# Patient Record
Sex: Male | Born: 1965 | Race: White | Hispanic: No | Marital: Married
Health system: Southern US, Community
[De-identification: ages and names within clinical notes are randomized; demographics above are authoritative.]

## PROBLEM LIST (undated history)

## (undated) DIAGNOSIS — K760 Fatty (change of) liver, not elsewhere classified: Secondary | ICD-10-CM

## (undated) DIAGNOSIS — R202 Paresthesia of skin: Secondary | ICD-10-CM

## (undated) DIAGNOSIS — B349 Viral infection, unspecified: Secondary | ICD-10-CM

## (undated) DIAGNOSIS — N529 Male erectile dysfunction, unspecified: Secondary | ICD-10-CM

## (undated) DIAGNOSIS — K219 Gastro-esophageal reflux disease without esophagitis: Secondary | ICD-10-CM

## (undated) DIAGNOSIS — B009 Herpesviral infection, unspecified: Secondary | ICD-10-CM

## (undated) DIAGNOSIS — K449 Diaphragmatic hernia without obstruction or gangrene: Secondary | ICD-10-CM

## (undated) DIAGNOSIS — Z8679 Personal history of other diseases of the circulatory system: Secondary | ICD-10-CM

## (undated) DIAGNOSIS — F419 Anxiety disorder, unspecified: Secondary | ICD-10-CM

## (undated) HISTORY — DX: Male erectile dysfunction, unspecified: N52.9

## (undated) HISTORY — DX: Diaphragmatic hernia without obstruction or gangrene: K44.9

## (undated) HISTORY — DX: Fatty (change of) liver, not elsewhere classified: K76.0

## (undated) HISTORY — PX: SKIN CANCER EXCISION: SHX779

## (undated) HISTORY — DX: Herpesviral infection, unspecified: B00.9

## (undated) HISTORY — DX: Gastro-esophageal reflux disease without esophagitis: K21.9

## (undated) HISTORY — DX: Viral infection, unspecified: B34.9

## (undated) HISTORY — DX: Personal history of other diseases of the circulatory system: Z86.79

## (undated) HISTORY — DX: Anxiety disorder, unspecified: F41.9

## (undated) HISTORY — DX: Paresthesia of skin: R20.2

## (undated) HISTORY — PX: OTHER SURGICAL HISTORY: SHX169

---

## 1992-07-12 HISTORY — PX: OTHER SURGICAL HISTORY: SHX169

## 2003-08-28 ENCOUNTER — Emergency Department (HOSPITAL_COMMUNITY): Admission: AD | Admit: 2003-08-28 | Discharge: 2003-08-28 | Payer: Self-pay | Admitting: Family Medicine

## 2003-08-30 ENCOUNTER — Encounter: Admission: RE | Admit: 2003-08-30 | Discharge: 2003-08-30 | Payer: Self-pay | Admitting: Family Medicine

## 2003-09-09 ENCOUNTER — Encounter: Admission: RE | Admit: 2003-09-09 | Discharge: 2003-09-09 | Payer: Self-pay | Admitting: Family Medicine

## 2004-07-06 IMAGING — CT CT PELVIS W/ CM
1 series · 16 of 32 positions shown, 20 images · IV contrast (GASTRO' & OMNIUPAQUE 100)
Comparison: none

CLINICAL DATA: Elevated liver function studies.  Con ?
 CT ABDOMEN AND PELVIS WITH CONTRAST
TECHNIQUE: Routine helical imaging carried out through the abdomen and pelvis utilizing oral and IV contrast (100 cc Omnipaque 300).  No prior exams for comparison:

[Series 2: routine abdomen · axial · 0.70mm/px · z∈[-368,+7]mm · 16 of 113 slices shown, 20 images]
[im 8/113  soft-tissue]
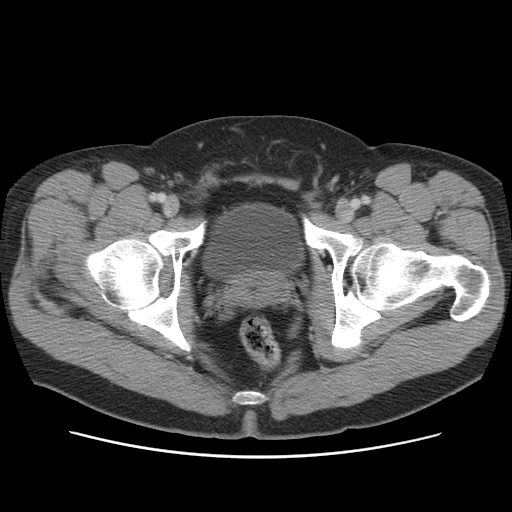
[im 8/113  bone]
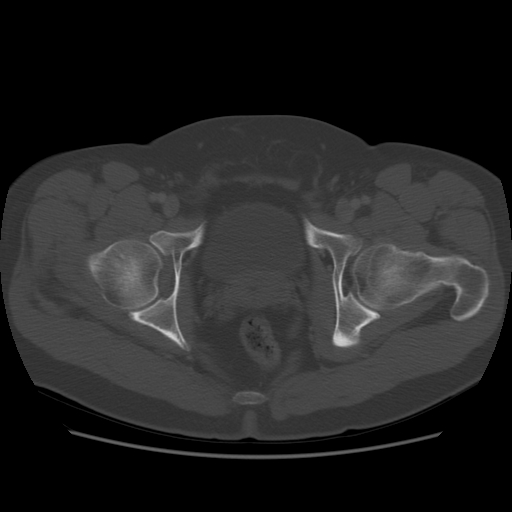
[im 15/113  soft-tissue]
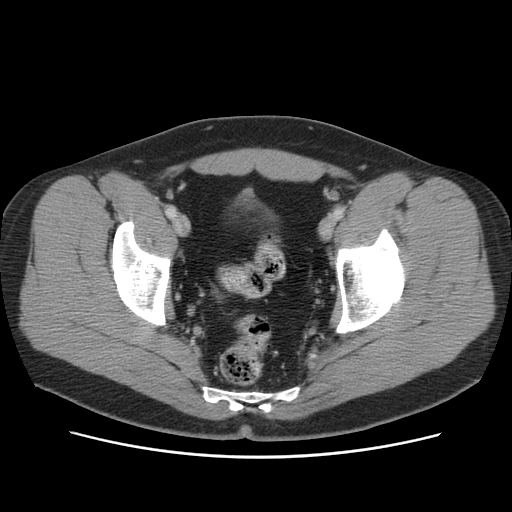
[im 22/113  soft-tissue]
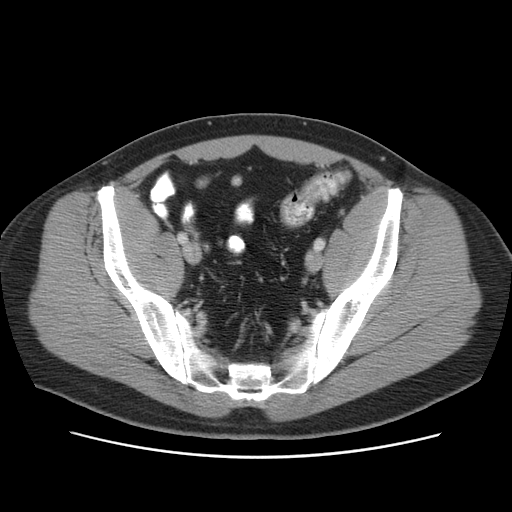
[im 29/113  soft-tissue]
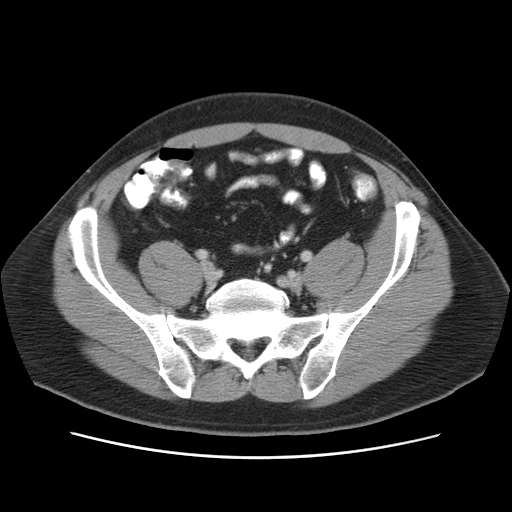
[im 37/113  soft-tissue]
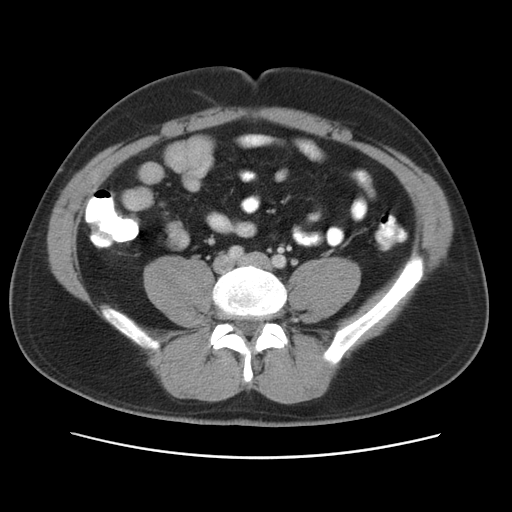
[im 44/113  soft-tissue]
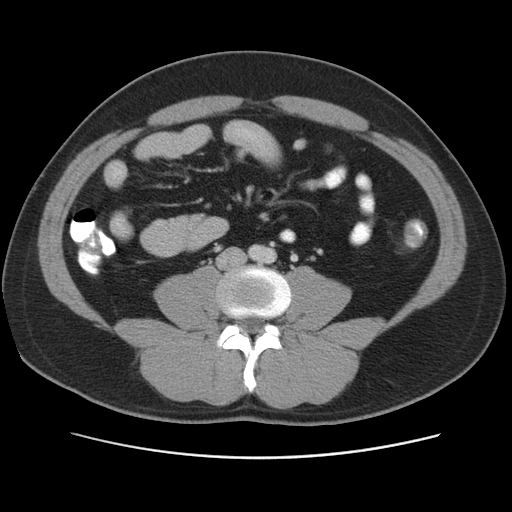
[im 51/113  soft-tissue]
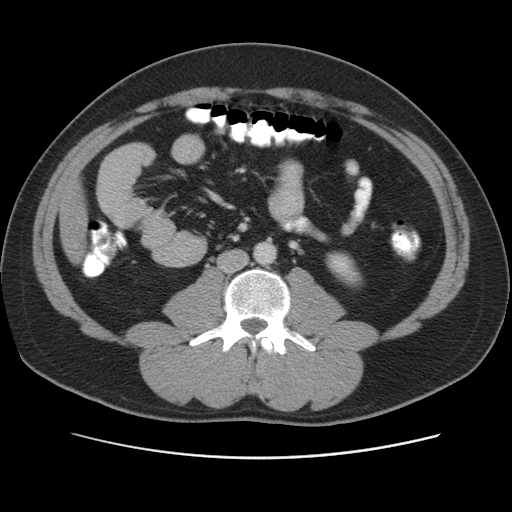
[im 62/113  soft-tissue]
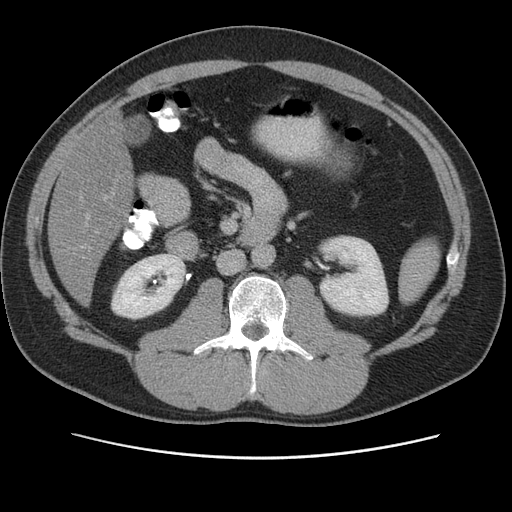
[im 69/113  soft-tissue]
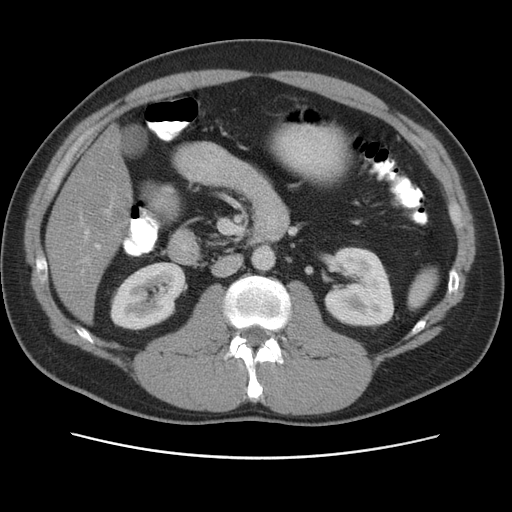
[im 69/113  bone]
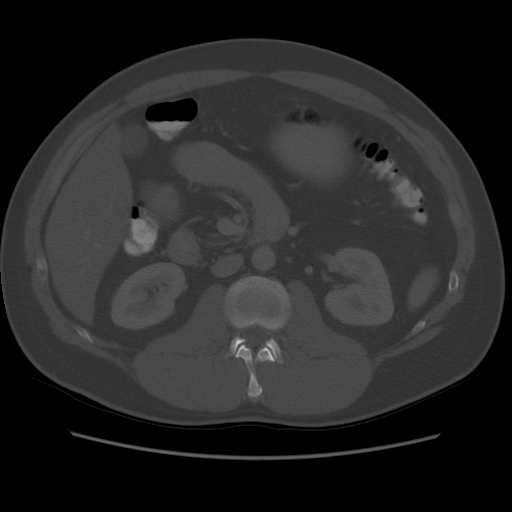
[im 76/113  soft-tissue]
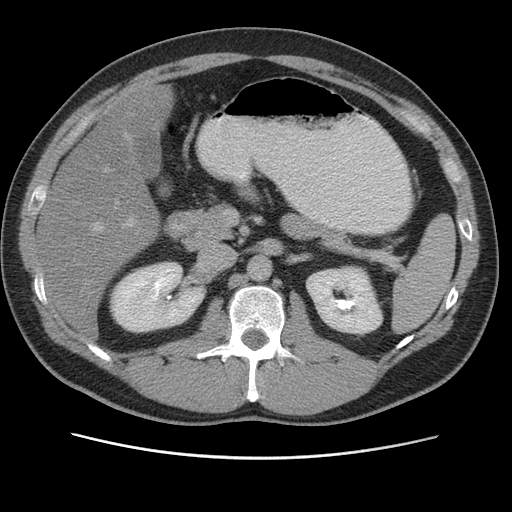
[im 84/113  soft-tissue]
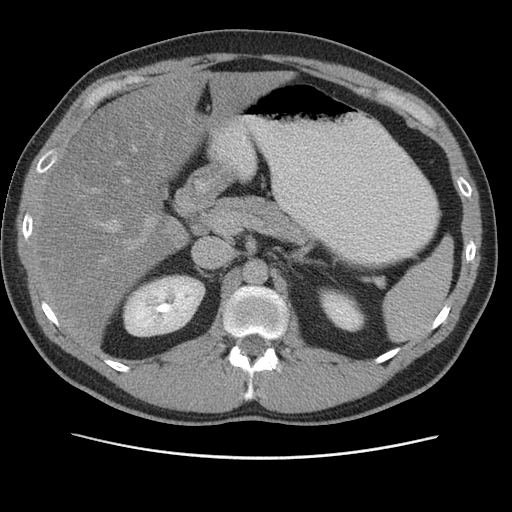
[im 91/113  soft-tissue]
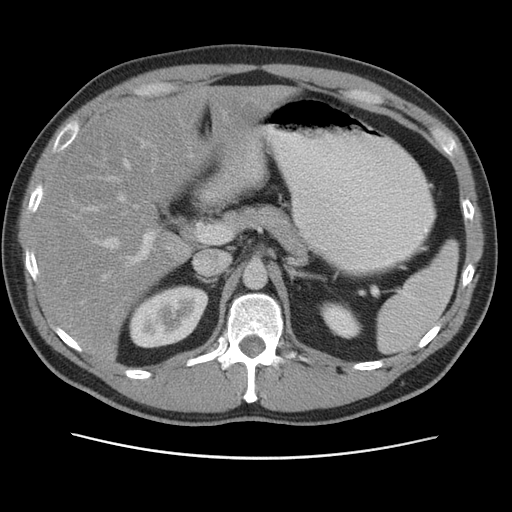
[im 98/113  soft-tissue]
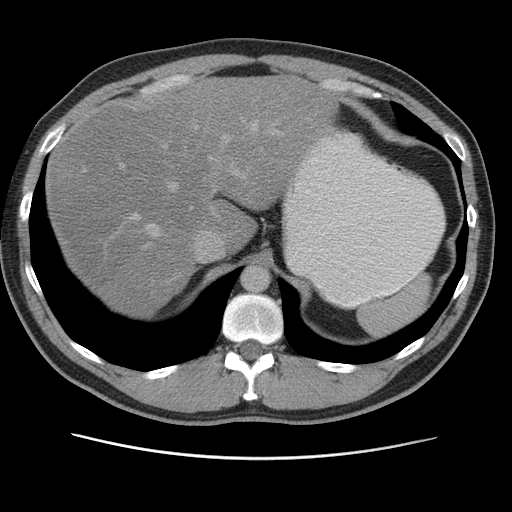
[im 98/113  lung]
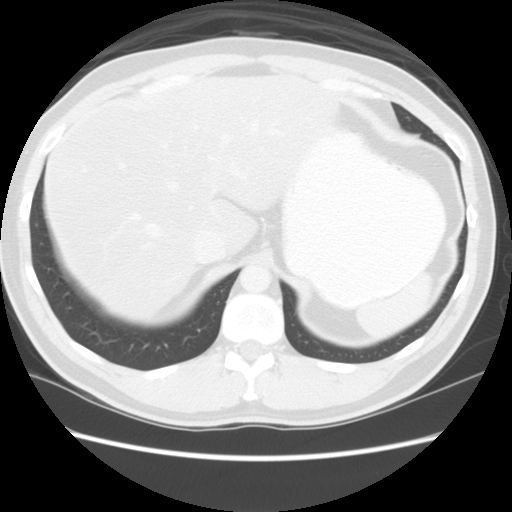
[im 102/113  lung]
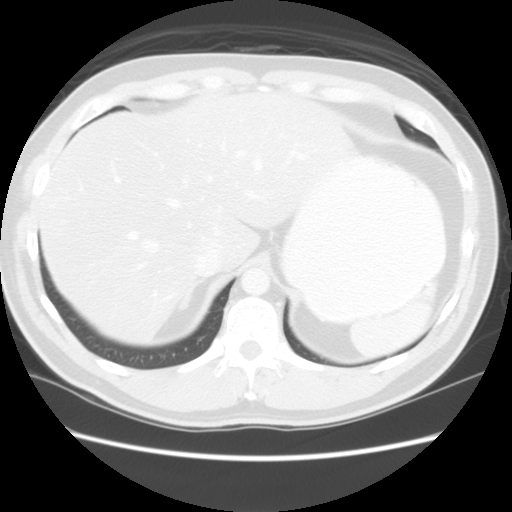
[im 105/113  soft-tissue]
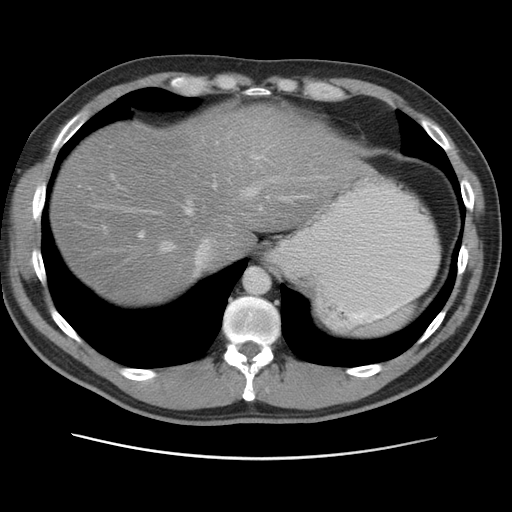
[im 105/113  lung]
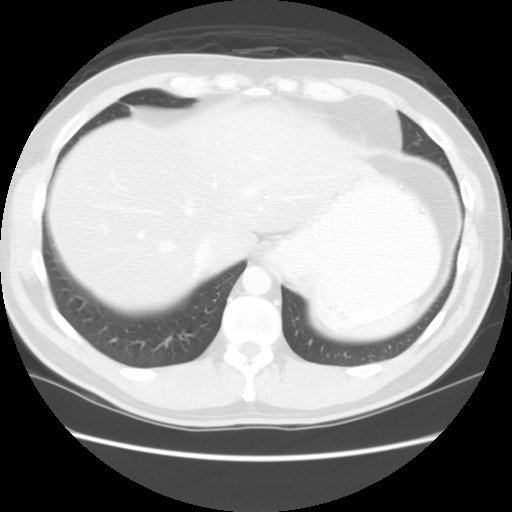
[im 109/113  lung]
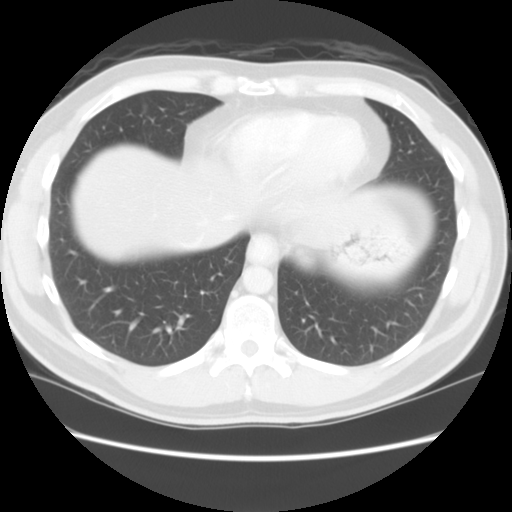

[16 of 32 positions shown; findings below may reference images not displayed]

CT ABDOMEN WITH CONTRAST:
 Lung bases clear.  Diffuse fatty infiltration of the liver with no focal lesions.  No calcified gallstones or bile duct dilatation.  Spleen, pancreas, and adrenals normal.  No adenopathy or ascites.  No renal lesions.
 IMPRESSION
 Normal except for fatty infiltration of the liver. 

 CT PELVIS WITH CONTRAST:
 No focal masses, adenopathy or fluid collections.  No changes to suggest diverticulitis.  There are some central prostatic calcifications incidentally noted. 

 There is mild dilatation of both inguinal rings ? left greater than right.  I cannot rule out inguinal hernias.  Careful clinical correlation warranted. 
 IMPRESSION
 No acute or significant findings.  There may be small bilateral inguinal hernias.

## 2008-12-17 ENCOUNTER — Emergency Department (HOSPITAL_COMMUNITY): Admission: EM | Admit: 2008-12-17 | Discharge: 2008-12-17 | Payer: Self-pay | Admitting: Emergency Medicine

## 2009-10-14 IMAGING — CR DG CHEST 2V
2 series · 2 of 2 positions shown · non-contrast
Comparison: None

CLINICAL DATA: Heart flutter

CHEST - 2 VIEW

[w chest pa]
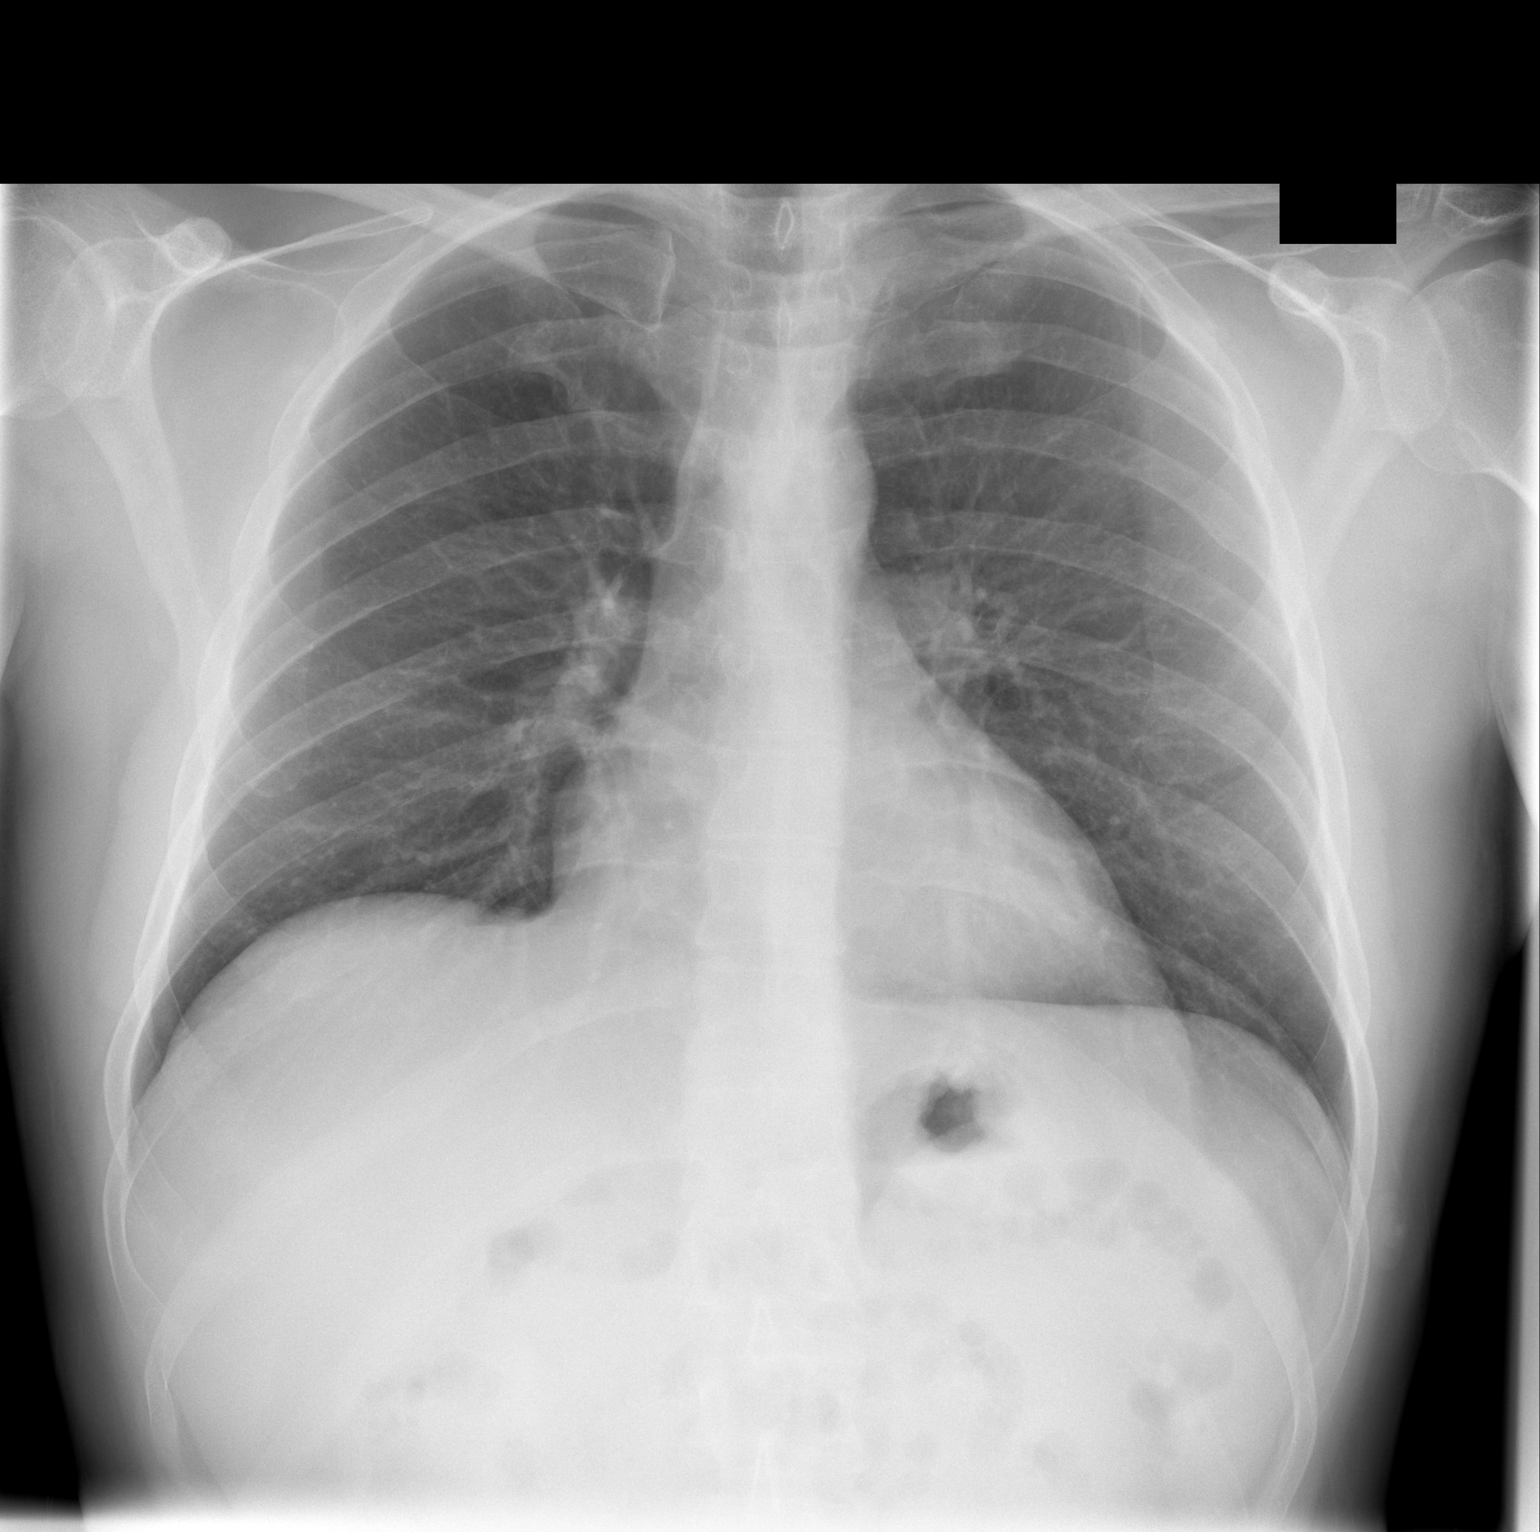

[w chest lat]
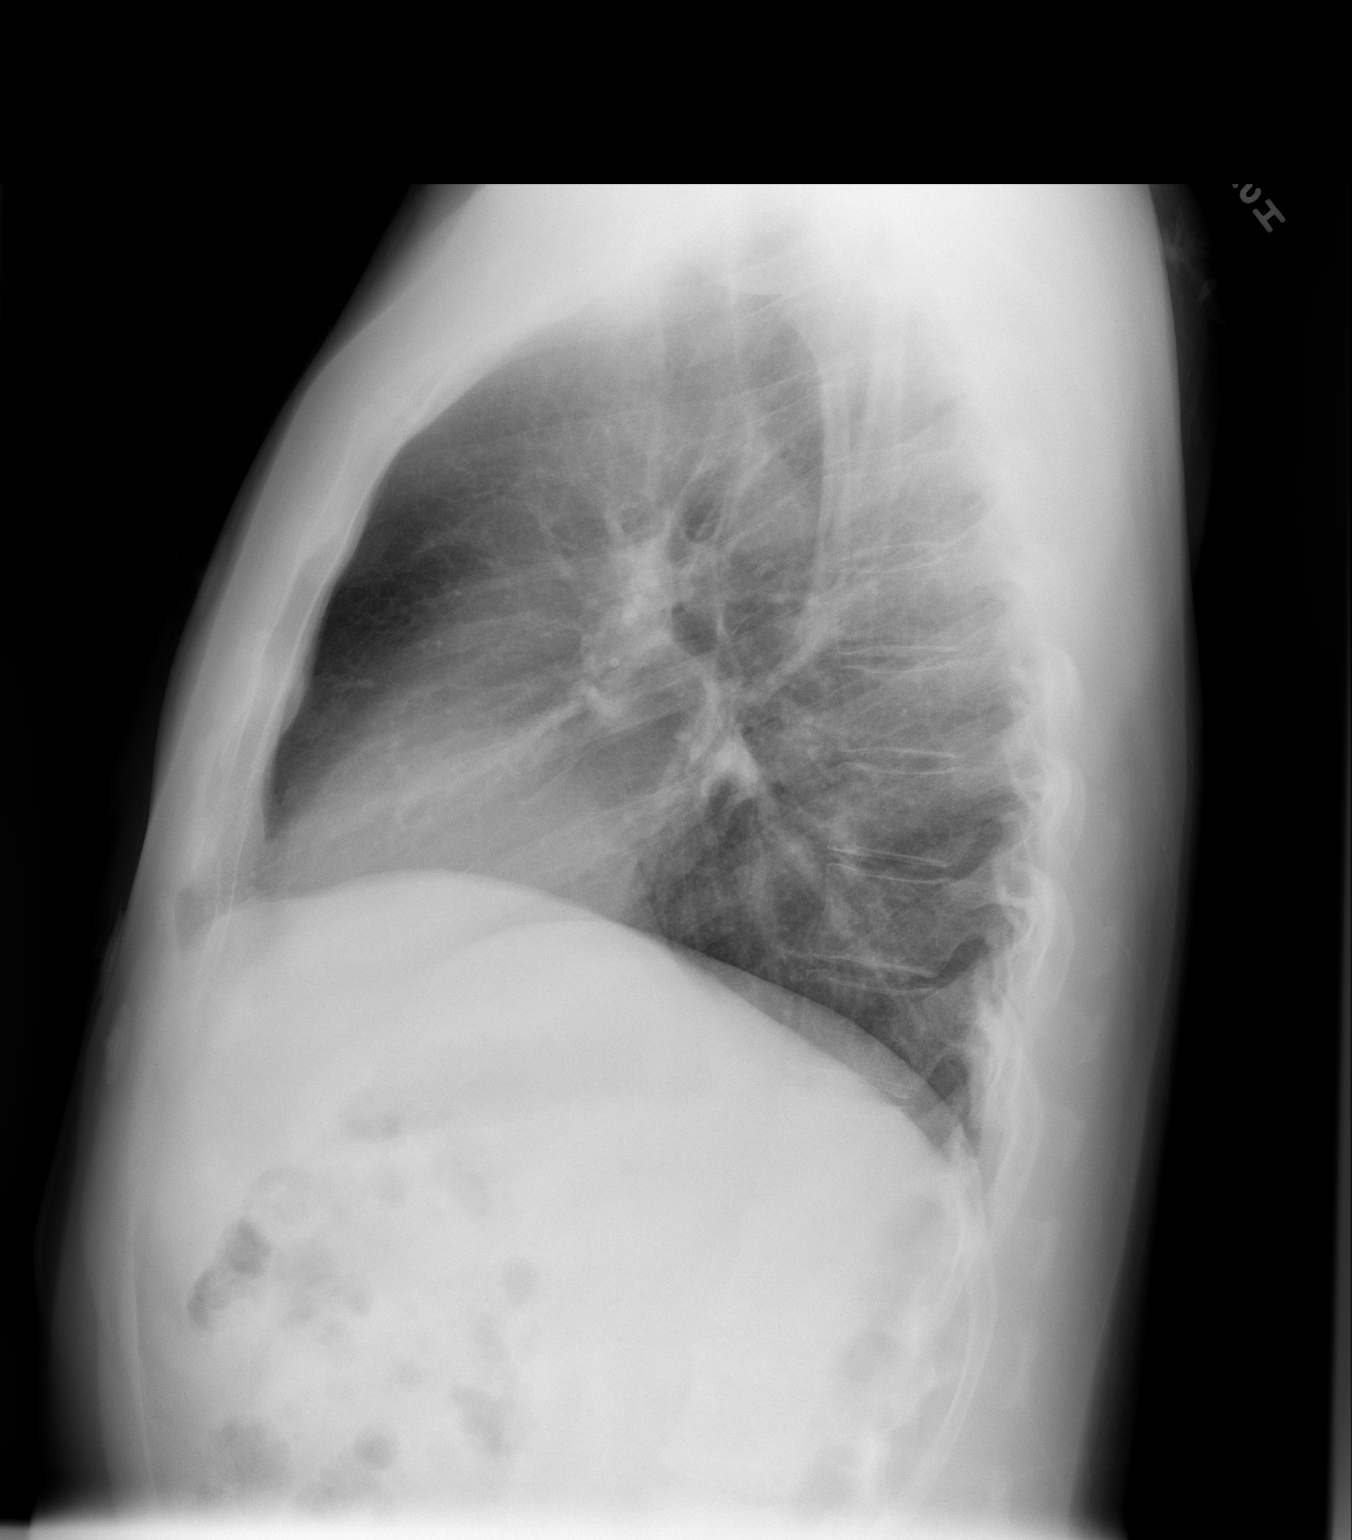

[2 of 2 positions shown; findings below may reference images not displayed]

FINDINGS: The cardiac silhouette, mediastinum, pulmonary
vasculature are within normal limits.  Both lungs are clear.  The
osseous structures are unremarkable.
IMPRESSION: Normal chest x-ray.

## 2010-10-19 LAB — D-DIMER, QUANTITATIVE: D-Dimer, Quant: <0.22 ug/mL-FEU (ref 0.00–0.48)

## 2010-10-19 LAB — CBC
HCT: 47.2 % (ref 39.0–52.0)
Hemoglobin: 15.9 g/dL (ref 13.0–17.0)
Platelets: 330 10*3/uL (ref 150–400)
WBC: 6.4 10*3/uL (ref 4.0–10.5)

## 2010-10-19 LAB — URINALYSIS, ROUTINE W REFLEX MICROSCOPIC
Bilirubin Urine: NEGATIVE
Glucose, UA: NEGATIVE mg/dL
Hgb urine dipstick: NEGATIVE
Ketones, ur: NEGATIVE mg/dL
Protein, ur: NEGATIVE mg/dL

## 2010-10-19 LAB — COMPREHENSIVE METABOLIC PANEL
Albumin: 4.4 g/dL (ref 3.5–5.2)
Alkaline Phosphatase: 66 U/L (ref 39–117)
BUN: 12 mg/dL (ref 6–23)
Chloride: 104 mEq/L (ref 96–112)
Glucose, Bld: 95 mg/dL (ref 70–99)
Potassium: 4 mEq/L (ref 3.5–5.1)
Total Bilirubin: 0.9 mg/dL (ref 0.3–1.2)

## 2010-10-19 LAB — DIFFERENTIAL
Basophils Absolute: 0 10*3/uL (ref 0.0–0.1)
Basophils Relative: 1 % (ref 0–1)
Eosinophils Absolute: 0.1 10*3/uL (ref 0.0–0.7)
Monocytes Absolute: 0.6 10*3/uL (ref 0.1–1.0)
Neutro Abs: 3.8 10*3/uL (ref 1.7–7.7)

## 2010-10-19 LAB — URINE CULTURE: Culture: NO GROWTH

## 2010-10-19 LAB — POCT CARDIAC MARKERS: Myoglobin, poc: 83.7 ng/mL (ref 12–200)

## 2021-03-30 ENCOUNTER — Telehealth (HOSPITAL_COMMUNITY): Payer: Self-pay

## 2021-03-30 NOTE — Telephone Encounter (Signed)
Attempted to contact patient to schedule OP MBS - left voicemail. ?

## 2021-04-02 ENCOUNTER — Other Ambulatory Visit (HOSPITAL_COMMUNITY): Payer: Self-pay

## 2021-04-02 DIAGNOSIS — R1310 Dysphagia, unspecified: Secondary | ICD-10-CM

## 2021-04-10 ENCOUNTER — Other Ambulatory Visit: Payer: Self-pay

## 2021-04-10 ENCOUNTER — Ambulatory Visit (HOSPITAL_COMMUNITY)
Admission: RE | Admit: 2021-04-10 | Discharge: 2021-04-10 | Disposition: A | Discharge status: Home / Self Care | Payer: 59 | Source: Ambulatory Visit | Attending: Internal Medicine | Admitting: Internal Medicine

## 2021-04-10 DIAGNOSIS — R1310 Dysphagia, unspecified: Secondary | ICD-10-CM | POA: Insufficient documentation

## 2023-07-28 ENCOUNTER — Encounter: Payer: Self-pay | Admitting: Cardiovascular Disease

## 2023-07-28 ENCOUNTER — Ambulatory Visit: Payer: 59 | Attending: Cardiovascular Disease | Admitting: Cardiovascular Disease

## 2023-07-28 VITALS — BP 116/78 | HR 75 | Ht 69.0 in | Wt 195.6 lb

## 2023-07-28 DIAGNOSIS — Z8679 Personal history of other diseases of the circulatory system: Secondary | ICD-10-CM

## 2023-07-28 DIAGNOSIS — R002 Palpitations: Secondary | ICD-10-CM

## 2023-07-28 NOTE — Patient Instructions (Signed)
 Medication Instructions:  Your physician recommends that you continue on your current medications as directed. Please refer to the Current Medication list given to you today. *If you need a refill on your cardiac medications before your next appointment, please call your pharmacy*   Follow-Up: At [facility], you and your health needs are our priority.  As part of our continuing mission to provide you with exceptional heart care, we have created designated Provider Care Teams.  These Care Teams include your primary Cardiologist (physician) and Advanced Practice Providers (APPs -  Physician Assistants and Nurse Practitioners) who all work together to provide you with the care you need, when you need it.  We recommend signing up for the patient portal called "MyChart".  Sign up information is provided on this After Visit Summary.  MyChart is used to connect with patients for Virtual Visits (Telemedicine).  Patients are able to view lab/test results, encounter notes, upcoming appointments, etc.  Non-urgent messages can be sent to your provider as well.   To learn more about what you can do with MyChart, go to [email or web address].    Your next appointment:   As needed  Provider:   York Pellant, MD

## 2023-07-28 NOTE — Progress Notes (Signed)
  Electrophysiology Office Note:    Date:  07/28/2023   ID:  Matthew Strickland, DOB [birth year], MRN [id number]  PCP:  Renford Dills, MD   [facility] Providers Cardiologist:  None     Referring MD: Renford Dills, MD   History of Present Illness:    Matthew Strickland is a 58 y.o. male with a medical history significant for WPW s/p ablation, referred for follow-up.       I discussed the use of AI scribe software for clinical note transcription with the patient, who gave verbal consent to proceed.  The patient, with a history of Wolff-Parkinson-White (WPW) syndrome, presents for a cardiology consultation on the recommendation of their primary care physician. They underwent ablation therapy for WPW in 1995, which was initially diagnosed in their early teens following an episode of tachycardia and arrhythmia while playing basketball. Prior to the ablation, they experienced rapid heart rates, but have had no recurrence of these symptoms since the procedure. The patient was previously on Inderal, but it is unclear if they are currently on any medications. They report no current issues with blood pressure or cholesterol.     Today, he reports that he is doing very well.  EKGs/Labs/Other Studies Reviewed Today:       EKG:   EKG Interpretation Date/Time:  Thursday July 28 2023 14:51:01 EST Ventricular Rate:  75 PR Interval:  148 QRS Duration:  92 QT Interval:  382 QTC Calculation: 426 R Axis:   51  Text Interpretation: Normal sinus rhythm Normal ECG Confirmed by York Pellant [phone]) on 07/28/2023 3:26:24 PM     Physical Exam:    VS:  BP 116/78 (BP Location: Left Arm, Patient Position: Sitting, Cuff Size: Large)   Pulse 75   Ht 5\' 9"  (1.753 m)   Wt 195 lb 9.6 oz (88.7 kg)   SpO2 98%   BMI 28.89 kg/m     Wt Readings from Last 3 Encounters:  07/28/23 195 lb 9.6 oz (88.7 kg)     GEN:  Well nourished, well developed in no acute distress CARDIAC: RRR, no murmurs,  rubs, gallops RESPIRATORY:  Normal work of breathing MUSCULOSKELETAL: no edema    ASSESSMENT & PLAN:     History of WPW Status post ablation at [facility] No preexcitation on EKG today No symptoms of SVT recurrence since ablation He appears to have been cured of the WPW No further evaluation electrophysiology follow-up indicated at this time       Signed, Maurice Small, MD  07/28/2023 3:42 PM    [facility]

## 2023-12-29 ENCOUNTER — Other Ambulatory Visit (HOSPITAL_COMMUNITY): Payer: Self-pay | Admitting: Sports Medicine

## 2023-12-29 DIAGNOSIS — M25511 Pain in right shoulder: Secondary | ICD-10-CM
